# Patient Record
Sex: Male | Born: 1960 | Race: White | Hispanic: No | Marital: Single | Smoking: Never smoker
Health system: Southern US, Community
[De-identification: ages and names within clinical notes are randomized; demographics above are authoritative.]

## PROBLEM LIST (undated history)

## (undated) DIAGNOSIS — K449 Diaphragmatic hernia without obstruction or gangrene: Secondary | ICD-10-CM

## (undated) DIAGNOSIS — R002 Palpitations: Secondary | ICD-10-CM

## (undated) DIAGNOSIS — K219 Gastro-esophageal reflux disease without esophagitis: Secondary | ICD-10-CM

## (undated) HISTORY — DX: Gastro-esophageal reflux disease without esophagitis: K21.9

## (undated) HISTORY — DX: Diaphragmatic hernia without obstruction or gangrene: K44.9

---

## 2002-02-27 ENCOUNTER — Encounter: Payer: Self-pay | Admitting: Emergency Medicine

## 2002-02-27 ENCOUNTER — Inpatient Hospital Stay (HOSPITAL_COMMUNITY): Admission: EM | Admit: 2002-02-27 | Discharge: 2002-02-28 | Payer: Self-pay | Admitting: Emergency Medicine

## 2002-03-02 ENCOUNTER — Encounter: Payer: Self-pay | Admitting: Emergency Medicine

## 2002-03-03 ENCOUNTER — Inpatient Hospital Stay (HOSPITAL_COMMUNITY): Admission: EM | Admit: 2002-03-03 | Discharge: 2002-03-04 | Payer: Self-pay | Admitting: Emergency Medicine

## 2002-03-04 ENCOUNTER — Encounter: Payer: Self-pay | Admitting: Cardiology

## 2005-03-14 ENCOUNTER — Emergency Department (HOSPITAL_COMMUNITY): Admission: EM | Admit: 2005-03-14 | Discharge: 2005-03-14 | Payer: Self-pay | Admitting: Family Medicine

## 2005-03-18 ENCOUNTER — Ambulatory Visit: Payer: Self-pay | Admitting: Cardiology

## 2005-03-22 ENCOUNTER — Ambulatory Visit: Payer: Self-pay | Admitting: Cardiology

## 2006-01-14 ENCOUNTER — Emergency Department (HOSPITAL_COMMUNITY): Admission: EM | Admit: 2006-01-14 | Discharge: 2006-01-14 | Payer: Self-pay | Admitting: Family Medicine

## 2010-09-02 HISTORY — PX: UPPER GI ENDOSCOPY: SHX6162

## 2010-10-10 ENCOUNTER — Other Ambulatory Visit (HOSPITAL_COMMUNITY): Payer: Self-pay | Admitting: Otolaryngology

## 2010-10-10 DIAGNOSIS — R52 Pain, unspecified: Secondary | ICD-10-CM

## 2010-10-12 ENCOUNTER — Ambulatory Visit (HOSPITAL_COMMUNITY)
Admission: RE | Admit: 2010-10-12 | Discharge: 2010-10-12 | Disposition: A | Discharge status: Home / Self Care | Payer: Commercial Managed Care - PPO | Source: Ambulatory Visit | Attending: Otolaryngology | Admitting: Otolaryngology

## 2010-10-12 DIAGNOSIS — R52 Pain, unspecified: Secondary | ICD-10-CM

## 2010-10-12 DIAGNOSIS — Q283 Other malformations of cerebral vessels: Secondary | ICD-10-CM | POA: Insufficient documentation

## 2010-10-12 DIAGNOSIS — J3489 Other specified disorders of nose and nasal sinuses: Secondary | ICD-10-CM | POA: Insufficient documentation

## 2010-10-12 DIAGNOSIS — I679 Cerebrovascular disease, unspecified: Secondary | ICD-10-CM | POA: Insufficient documentation

## 2010-10-12 DIAGNOSIS — R51 Headache: Secondary | ICD-10-CM | POA: Insufficient documentation

## 2010-10-12 MED ORDER — GADOBENATE DIMEGLUMINE 529 MG/ML IV SOLN: 17.0000 mL | Freq: Once | INTRAVENOUS | Status: AC | PRN

## 2011-07-17 ENCOUNTER — Other Ambulatory Visit (HOSPITAL_COMMUNITY): Payer: Self-pay | Admitting: Family Medicine

## 2011-07-18 ENCOUNTER — Ambulatory Visit (HOSPITAL_COMMUNITY)
Admission: RE | Admit: 2011-07-18 | Discharge: 2011-07-18 | Disposition: A | Discharge status: Home / Self Care | Payer: 59 | Source: Ambulatory Visit | Attending: Family Medicine | Admitting: Family Medicine

## 2011-07-18 DIAGNOSIS — R109 Unspecified abdominal pain: Secondary | ICD-10-CM | POA: Insufficient documentation

## 2011-07-19 ENCOUNTER — Other Ambulatory Visit: Payer: Self-pay

## 2011-07-19 ENCOUNTER — Emergency Department (HOSPITAL_COMMUNITY): Payer: 59

## 2011-07-19 ENCOUNTER — Emergency Department (HOSPITAL_COMMUNITY)
Admission: EM | Admit: 2011-07-19 | Discharge: 2011-07-19 | Disposition: A | Discharge status: Home / Self Care | Payer: 59 | Attending: Emergency Medicine | Admitting: Emergency Medicine

## 2011-07-19 DIAGNOSIS — R002 Palpitations: Secondary | ICD-10-CM | POA: Insufficient documentation

## 2011-07-19 DIAGNOSIS — R110 Nausea: Secondary | ICD-10-CM | POA: Insufficient documentation

## 2011-07-19 DIAGNOSIS — R0789 Other chest pain: Secondary | ICD-10-CM | POA: Insufficient documentation

## 2011-07-19 DIAGNOSIS — K219 Gastro-esophageal reflux disease without esophagitis: Secondary | ICD-10-CM | POA: Insufficient documentation

## 2011-07-19 HISTORY — DX: Palpitations: R00.2

## 2011-07-19 LAB — COMPREHENSIVE METABOLIC PANEL
ALT: 25 U/L (ref 0–53)
AST: 23 U/L (ref 0–37)
Alkaline Phosphatase: 68 U/L (ref 39–117)
CO2: 26 mEq/L (ref 19–32)
Calcium: 9.7 mg/dL (ref 8.4–10.5)
Chloride: 102 mEq/L (ref 96–112)
GFR calc Af Amer: >90 mL/min (ref >90)
GFR calc non Af Amer: >90 mL/min (ref >90)
Glucose, Bld: 84 mg/dL (ref 70–99)
Sodium: 139 mEq/L (ref 135–145)
Total Bilirubin: 0.5 mg/dL (ref 0.3–1.2)

## 2011-07-19 LAB — CBC
Hemoglobin: 16.3 g/dL (ref 13.0–17.0)
MCH: 30.8 pg (ref 26.0–34.0)
Platelets: 151 10*3/uL (ref 150–400)
RBC: 5.3 MIL/uL (ref 4.22–5.81)
WBC: 6.3 10*3/uL (ref 4.0–10.5)

## 2011-07-19 MED ORDER — ASPIRIN 81 MG PO CHEW
324.0000 mg | CHEWABLE_TABLET | Freq: Once | ORAL | Status: AC
  Administered 2011-07-19: 324 mg via ORAL
  Filled 2011-07-19: qty 4

## 2011-07-19 MED ORDER — IOHEXOL 300 MG/ML  SOLN
80.0000 mL | Freq: Once | INTRAMUSCULAR | Status: AC | PRN
  Administered 2011-07-19: 80 mL via INTRAVENOUS

## 2011-07-19 MED ORDER — ESOMEPRAZOLE MAGNESIUM 40 MG PO CPDR: 40.0000 mg | DELAYED_RELEASE_CAPSULE | Freq: Every day | ORAL | Status: DC

## 2011-07-19 MED ORDER — GI COCKTAIL ~~LOC~~
30.0000 mL | Freq: Once | ORAL | Status: AC
  Administered 2011-07-19: 30 mL via ORAL
  Filled 2011-07-19: qty 30

## 2011-07-19 NOTE — ED Notes (Signed)
New and old ekg shown to dr Fredricka Bonine at [facility]

## 2011-07-19 NOTE — ED Provider Notes (Signed)
Medical screening examination/treatment/procedure(s) were performed by non-physician practitioner and as supervising physician I was immediately available for consultation/collaboration.   Jalaina Salyers M Dreydon Cardenas, DO 07/19/11 [id number] 

## 2011-07-19 NOTE — ED Provider Notes (Signed)
History     CSN: [id number] Arrival date & time: 07/19/2011 12:21 PM   None     Chief Complaint  Patient presents with  . Chest Pain    (Consider location/radiation/quality/duration/timing/severity/associated sxs/prior treatment) Patient is a 50 y.o. male presenting with chest pain. The history is provided by the patient. No language interpreter was used.  Chest Pain The chest pain began more than 2 weeks ago. Chest pain occurs intermittently. The chest pain is worsening. The pain is associated with eating. At its most intense, the pain is at 4/10. The pain is currently at 0/10. The severity of the pain is moderate. The quality of the pain is described as pressure-like. The pain does not radiate. Primary symptoms include palpitations and nausea. Pertinent negatives for primary symptoms include no fever, no shortness of breath, no cough, no wheezing, no abdominal pain, no vomiting and no dizziness.  The palpitations did not occur with dizziness or shortness of breath.  Pertinent negatives for associated symptoms include no diaphoresis, no lower extremity edema, no near-syncope, no numbness and no weakness. He tried nothing for the symptoms. Risk factors include male gender.  Pertinent negatives for past medical history include no aneurysm, no anxiety/panic attacks, no aortic aneurysm, no aortic dissection, no arrhythmia, no bicuspid aortic valve, no CAD, no cancer, no congenital heart disease, no connective tissue disease, no CHF, no DVT, no hyperlipidemia, no hypertension, no MI, no PE and no PVD.   Pt reports intermittant epigastric "pressure" x 3 weeks with a negative u/s of the abdomen yesterday.  States that on Tuesday he ate fried fish which made the pressure/pain worse immediately.  No major health problems.  Did not want to start nexium per his doctor [facility] with [facility].  Denies etoh but did drink a large cup of coffee this am.  Describes the pain as mostly "irritable"Today he was working at  his desk and felt nausea then  the pressure then felt heart palpatations that lasted just a few minutes.  He was working upstairs and came to the ER after that.   Denies SOB or vomiting.  Pain free presently.    Past Medical History  Diagnosis Date  . Palpitations     History reviewed. No pertinent past surgical history.  History reviewed. No pertinent family history.  History  Substance Use Topics  . Smoking status: Never Smoker   . Smokeless tobacco: Not on file  . Alcohol Use: No      Review of Systems  Constitutional: Negative for fever and diaphoresis.  Eyes: Negative.   Respiratory: Negative.  Negative for cough, shortness of breath and wheezing.   Cardiovascular: Positive for chest pain and palpitations. Negative for leg swelling and near-syncope.  Gastrointestinal: Positive for nausea. Negative for vomiting and abdominal pain.       Pressure under my diaphram intermittanly x 3 weeks.   Genitourinary: Negative.   Neurological: Negative.  Negative for dizziness, weakness and numbness.  Psychiatric/Behavioral: Negative.   All other systems reviewed and are negative.    Allergies  Penicillins  Home Medications   Current Outpatient Rx  Name Route Sig Dispense Refill  . THERA M PLUS PO TABS Oral Take 1 tablet by mouth daily.        BP 135/74  Pulse 83  Temp 97.5 F (36.4 C)  Resp 16  SpO2 98%  Physical Exam  Nursing note and vitals reviewed. Constitutional: He is oriented to person, place, and time. He appears well-developed and well-nourished.  HENT:  Head: Normocephalic and atraumatic.  Eyes: Pupils are equal, round, and reactive to light.  Neck: Normal range of motion. Neck supple.  Cardiovascular: Normal rate.   Pulmonary/Chest: Effort normal and breath sounds normal. No respiratory distress. He has no wheezes. He has no rales. He exhibits no tenderness.  Abdominal: Soft. Bowel sounds are normal. He exhibits no distension. There is no tenderness.  There is no guarding.  Musculoskeletal: Normal range of motion.  Neurological: He is alert and oriented to person, place, and time.  Skin: Skin is warm and dry.  Psychiatric: He has a normal mood and affect.    ED Course  Procedures (including critical care time)   Labs Reviewed  CBC  COMPREHENSIVE METABOLIC PANEL  I-STAT TROPONIN I  LIPASE, BLOOD   US Abdomen Complete  07/18/2011  *RADIOLOGY REPORT*  Clinical Data:  Abdominal pain.  Evaluate for cholecystitis  COMPLETE ABDOMINAL ULTRASOUND  Comparison:  None.  Findings:  Gallbladder:  No gallstones, gallbladder wall thickening, or pericholecystic fluid. Evaluation for a sonographic Murphy's sign is negative  Common bile duct:  Has a diameter 3.1 mm and a normal appearance  Liver:  No focal lesion identified.  Within normal limits in parenchymal echogenicity. No signs of intrahepatic ductal dilatation are identified  IVC:  Appears normal.  Pancreas:  Is poorly assessed due to shadowing from overlying gas  Spleen:  Has a sagittal length of 7.5 cm with no focal abnormalities seen  Right Kidney:  Demonstrates a sagittal length of 10.6 cm with no focal parenchymal abnormality or signs of hydronephrosis evident  Left Kidney:  Has a sagittal length of 10.7 cm with no focal parenchymal abnormality or signs of hydronephrosis evident  Abdominal aorta:  Has a maximal caliber of 2.0 cm with no aneurysmal dilatation seen.  IMPRESSION: Poor evaluation of the pancreas was possible due to shadowing from overlying bowel gas.  Otherwise unremarkable abdominal ultrasound with no signs of cholelithiasis or cholecystitis seen.  Original Report Authenticated By: Bertha Stakes, M.D.     No diagnosis found.    MDM  Epigastric pain x 3 weeks intermittant.  U/s negative for gallbladder disease.  PCP told him to try nexium but he did not want to.  CT of the abdomen was also normal today.  Pain worse after eating.  States that he will start the nexium now.   Cmet, CBC and lipase normal today.  Normal cardiac markers.  Pain free in the ER.          Jethro Bastos, NP 07/19/11 [phone]

## 2011-07-19 NOTE — ED Notes (Signed)
Patient presents with sudden onset of palpitations that lasted approx 5 min ago.  Patient also reports intermittent epigastric to midsternal chest pressure.  Patient had an ultrasound yesterday for his gall bladder and had a negative exam.

## 2011-07-19 NOTE — ED Notes (Signed)
Pt coming from work where he began having palpitations an epigastric pressure and discomfort.  Pt reports that he had an ultrasound yesterday of his gallbladder that was reported normal but pt still believes gallbladder is the root of his problem.

## 2011-07-22 ENCOUNTER — Encounter: Payer: Self-pay | Admitting: *Deleted

## 2011-07-23 ENCOUNTER — Encounter: Payer: Self-pay | Admitting: Gastroenterology

## 2011-07-23 ENCOUNTER — Ambulatory Visit (INDEPENDENT_AMBULATORY_CARE_PROVIDER_SITE_OTHER): Payer: Commercial Managed Care - PPO | Admitting: Gastroenterology

## 2011-07-23 VITALS — BP 128/82 | HR 68 | Ht 67.0 in | Wt 171.0 lb

## 2011-07-23 DIAGNOSIS — K219 Gastro-esophageal reflux disease without esophagitis: Secondary | ICD-10-CM | POA: Insufficient documentation

## 2011-07-23 DIAGNOSIS — R079 Chest pain, unspecified: Secondary | ICD-10-CM | POA: Insufficient documentation

## 2011-07-23 MED ORDER — SUCRALFATE 1 GM/10ML PO SUSP: ORAL | Status: DC

## 2011-07-23 MED ORDER — HYOSCYAMINE SULFATE 0.125 MG SL SUBL: 0.1250 mg | SUBLINGUAL_TABLET | SUBLINGUAL | Status: DC | PRN

## 2011-07-23 MED ORDER — DEXLANSOPRAZOLE 60 MG PO CPDR: DELAYED_RELEASE_CAPSULE | ORAL | Status: DC

## 2011-07-23 MED ORDER — HYOSCYAMINE SULFATE 0.125 MG SL SUBL: 0.1250 mg | SUBLINGUAL_TABLET | SUBLINGUAL | Status: AC | PRN

## 2011-07-23 NOTE — Progress Notes (Signed)
History of Present Illness:  This is a very pleasant 50 year old Caucasian male with 6 weeks of recurrent epigastric abdominal pain and subxiphoid pain with associated nausea, indigestion, occasional palpitations. He also has noticed intermittent right upper quadrant pain which is very transient in nature without real precipitating or alleviating elements. The patient has noticed that overeating worsens his epigastric pain, and he is on frequent small feeding meal plan. The patient does use antacids with some improvement. He previously was on large doses of Advil which he allegedly has not used in 6 months. Patient also has noticed occasional wakening from sleep with epigastric abdominal pain, but mostly notices an" air bubble" the subxiphoid area that would be alleviated by belching. There's no history of specific hepatobiliary or systemic complaints. He has had ultrasound of the upper abdomen and CT scan, both were reviewed and are unremarkable. Lab review shows normal CBC, liver profile, amylase and lipase. The patient has not had previous endoscopic exam. He denies any lower gastrointestinal problems, melena, or hematochezia, fever chills. Family history is remarkable for acid reflux problems in his parents and his brother. He is a previous negative cardiac evaluation, and cardiac enzymes with recent emergency room visit were normal.  I have reviewed this patient's present history, medical and surgical past history, allergies and medications.     ROS: The remainder of the 10 point ROS is negative... no previous abdominal surgery or known history of peptic ulcer disease, pancreatitis or hepatitis. Patient does not abuse alcohol, cigarettes, or NSAIDs.  Past Medical History  Diagnosis Date  . Palpitations    No past surgical history on file.  reports that he has never smoked. He has never used smokeless tobacco. He reports that he does not drink alcohol or use illicit drugs. family history includes  Arthritis in his father; Dementia in his mother; Heart disease in his maternal uncle; Hypertension in his mother; and Hypothyroidism in his mother and sister. Allergies  Allergen Reactions  . Penicillins Other (See Comments)    Unknown.          Physical Exam: General well developed well nourished patient in no acute distress, appearing his stated age Eyes PERRLA, no icterus, fundoscopic exam per opthamologist Skin no lesions noted Neck supple, no adenopathy, no thyroid enlargement, no tenderness Chest clear to percussion and auscultation Heart no significant murmurs, gallops or rubs noted Abdomen no hepatosplenomegaly masses or tenderness, BS normal.  Extremities no acute joint lesions, edema, phlebitis or evidence of cellulitis. Neurologic patient oriented x 3, cranial nerves intact, no focal neurologic deficits noted. Psychological mental status normal and normal affect.  Assessment and plan: Clinical presentation consistent with hiatal hernia, GERD, and esophageal spasm. I have placed him on Dexilant 60 mg a day with when necessary sublingual Levsin and when necessary Carafate suspension. Patient video to allow so as to reflux in his management. Endoscopic exam at his convenience has been scheduled. I see no need for further labs at this time  Encounter Diagnoses  Name Primary?  . GERD (gastroesophageal reflux disease) Yes  . Chest pain

## 2011-07-23 NOTE — Progress Notes (Signed)
Addended by: Richardson Chiquito on: 07/23/2011 09:22 AM   Modules accepted: Orders

## 2011-07-23 NOTE — Patient Instructions (Addendum)
You have been scheduled for an endoscopy. Please follow written instructions given to you at your visit today. We have sent the following medications to your pharmacy for you to pick up at your convenience: Levsin (dissolve 1 tablet under the tongue every 4 hours as needed) Dexilant (1 capsule by mouth at dinner in place of Nexium) Carafate (10 ml by mouth before meals and at bedtime) We have shown you a movie on GERD today. We have given you information regarding GERD. CC: Dr Ancil Boozer  Of note: D/C'ed Dexilant, Levsin and carafate prescriptions with Marisue Humble at [facility] in [place] as patient decided that he would rather have prescriptions filled at [facility] pharmacy after prescriptions were already sent. Ronny Bacon, CMA 07/23/11 @ 9:18 am

## 2011-08-02 ENCOUNTER — Ambulatory Visit (AMBULATORY_SURGERY_CENTER): Payer: 59 | Admitting: Gastroenterology

## 2011-08-02 ENCOUNTER — Encounter: Payer: Self-pay | Admitting: Gastroenterology

## 2011-08-02 VITALS — BP 131/80 | HR 82 | Temp 97.5°F | Resp 18 | Ht 67.0 in | Wt 171.0 lb

## 2011-08-02 DIAGNOSIS — R079 Chest pain, unspecified: Secondary | ICD-10-CM

## 2011-08-02 DIAGNOSIS — K219 Gastro-esophageal reflux disease without esophagitis: Secondary | ICD-10-CM

## 2011-08-02 MED ORDER — SODIUM CHLORIDE 0.9 % IV SOLN: 500.0000 mL | INTRAVENOUS | Status: DC

## 2011-08-02 NOTE — Progress Notes (Signed)
Patient did not experience any of the following events: a burn prior to discharge; a fall within the facility; wrong site/side/patient/procedure/implant event; or a hospital transfer or hospital admission upon discharge from the facility. (G8907) Patient did not have preoperative order for IV antibiotic SSI prophylaxis. (G8918)  

## 2011-08-02 NOTE — Patient Instructions (Signed)
Please review discharge instructions  Resume your normal medications  Await biopsy results  Please call Dr. Norval Gable office next week to set up follow up office visit for 1 month

## 2011-08-05 ENCOUNTER — Telehealth: Payer: Self-pay | Admitting: *Deleted

## 2011-08-05 DIAGNOSIS — K219 Gastro-esophageal reflux disease without esophagitis: Secondary | ICD-10-CM

## 2011-08-05 NOTE — Telephone Encounter (Signed)
LMOM

## 2011-08-09 ENCOUNTER — Telehealth: Payer: Self-pay | Admitting: Gastroenterology

## 2011-08-09 MED ORDER — DEXLANSOPRAZOLE 60 MG PO CPDR: DELAYED_RELEASE_CAPSULE | ORAL | Status: DC

## 2011-08-09 NOTE — Telephone Encounter (Signed)
rx sent pt aware 

## 2011-08-12 ENCOUNTER — Encounter: Payer: Self-pay | Admitting: Gastroenterology

## 2011-08-30 ENCOUNTER — Encounter: Payer: Self-pay | Admitting: *Deleted

## 2011-09-06 ENCOUNTER — Ambulatory Visit (INDEPENDENT_AMBULATORY_CARE_PROVIDER_SITE_OTHER): Payer: 59 | Admitting: Gastroenterology

## 2011-09-06 ENCOUNTER — Telehealth: Payer: Self-pay | Admitting: *Deleted

## 2011-09-06 ENCOUNTER — Encounter: Payer: Self-pay | Admitting: Gastroenterology

## 2011-09-06 ENCOUNTER — Other Ambulatory Visit (INDEPENDENT_AMBULATORY_CARE_PROVIDER_SITE_OTHER): Payer: 59

## 2011-09-06 VITALS — BP 118/72 | HR 80 | Ht 67.0 in | Wt 169.6 lb

## 2011-09-06 DIAGNOSIS — R1032 Left lower quadrant pain: Secondary | ICD-10-CM

## 2011-09-06 DIAGNOSIS — K219 Gastro-esophageal reflux disease without esophagitis: Secondary | ICD-10-CM

## 2011-09-06 LAB — URINALYSIS
Ketones, ur: NEGATIVE
Specific Gravity, Urine: <=1.005 (ref 1.000–1.030)
Total Protein, Urine: NEGATIVE
Urine Glucose: NEGATIVE
Urobilinogen, UA: 0.2 (ref 0.0–1.0)

## 2011-09-06 NOTE — Telephone Encounter (Signed)
Message copied by Leonette Monarch on Fri Sep 06, 2011 11:59 AM ------      Message from: Jarold Motto, DAVID R      Created: Fri Sep 06, 2011 11:54 AM       Please call..normal urine.Marland KitchenMarland Kitchen

## 2011-09-06 NOTE — Telephone Encounter (Signed)
Left message labs normal if he has questions to call back .

## 2011-09-06 NOTE — Patient Instructions (Signed)
Please go to the basement today for your labs.   

## 2011-09-06 NOTE — Progress Notes (Signed)
This is a very pleasant 51 year old Caucasian male with regurgitation completely eliminated by Dexilant 60 mg before his evening meal. Recent endoscopy was unremarkable otherwise. Biopsies for H. pylori were negative. He does complain of some bilateral flank pain, but denies other genitourinary problems. He denies a history of recurrent urinary tract infections or any symptoms of prostatic enlargement. Has made major lifestyle changes in his diet, exercise, and daily routines.  Current Medications, Allergies, Past Medical History, Past Surgical History, Family History and Social History were reviewed in [employer] record.  Pertinent Review of Systems Negative   Physical Exam: Blood pressure 118/72, BMI 26.6, pulse 80 and regular. Normal healthy-appearing patient in no distress. There is no CVA tenderness noted, and abdominal exam is unremarkable. Mental status is normal.   Assessment and Plan: GERD improving with PPI therapy and lifestyle changes. We will continue Dexilant for another 2 months, and then he may try to stop this medication depending on his symptomatology. I advised him that he will need colonoscopy at age 70. He is to call is for when necessary followup as needed. Urinalysis ordered per his complaints of bilateral CVA discomfort. Encounter Diagnoses  Name Primary?  Marland Kitchen LLQ pain Yes  . GERD (gastroesophageal reflux disease)

## 2013-07-08 ENCOUNTER — Other Ambulatory Visit: Payer: Self-pay

## 2014-06-01 ENCOUNTER — Other Ambulatory Visit: Payer: Self-pay | Admitting: Family Medicine

## 2014-06-01 ENCOUNTER — Ambulatory Visit
Admission: RE | Admit: 2014-06-01 | Discharge: 2014-06-01 | Disposition: A | Discharge status: Home / Self Care | Payer: 59 | Source: Ambulatory Visit | Attending: Family Medicine | Admitting: Family Medicine

## 2014-06-01 DIAGNOSIS — R1084 Generalized abdominal pain: Secondary | ICD-10-CM

## 2015-08-21 ENCOUNTER — Telehealth: Payer: 59 | Admitting: Family

## 2015-08-21 DIAGNOSIS — R05 Cough: Secondary | ICD-10-CM | POA: Diagnosis not present

## 2015-08-21 DIAGNOSIS — R059 Cough, unspecified: Secondary | ICD-10-CM

## 2015-08-21 DIAGNOSIS — J069 Acute upper respiratory infection, unspecified: Secondary | ICD-10-CM

## 2015-08-21 MED ORDER — AZITHROMYCIN 250 MG PO TABS: ORAL_TABLET | ORAL | Status: DC

## 2015-08-21 MED ORDER — BENZONATATE 200 MG PO CAPS: 200.0000 mg | ORAL_CAPSULE | Freq: Three times a day (TID) | ORAL | Status: DC | PRN

## 2015-08-21 NOTE — Progress Notes (Signed)

## 2015-08-31 ENCOUNTER — Telehealth: Payer: 59 | Admitting: Physician Assistant

## 2015-08-31 DIAGNOSIS — J019 Acute sinusitis, unspecified: Secondary | ICD-10-CM | POA: Diagnosis not present

## 2015-08-31 DIAGNOSIS — B9689 Other specified bacterial agents as the cause of diseases classified elsewhere: Secondary | ICD-10-CM

## 2015-08-31 DIAGNOSIS — R05 Cough: Secondary | ICD-10-CM

## 2015-08-31 DIAGNOSIS — R059 Cough, unspecified: Secondary | ICD-10-CM

## 2015-08-31 MED ORDER — DOXYCYCLINE HYCLATE 100 MG PO CAPS: 100.0000 mg | ORAL_CAPSULE | Freq: Two times a day (BID) | ORAL | Status: DC

## 2015-08-31 NOTE — Progress Notes (Signed)
We are sorry that you are not feeling well.  Here is how we plan to help!  Based on what you have shared with me it looks like you have sinusitis.  Sinusitis is inflammation and infection in the sinus cavities of the head.  Based on your presentation I believe you most likely have Acute Bacterial Sinusitis.  This is an infection caused by bacteria and is treated with antibiotics. I have prescribed Doxycycline 100mg  by mouth twice a day for 10 days. You may use an oral decongestant such as Mucinex D or if you have glaucoma or high blood pressure use plain Mucinex. Saline nasal spray help and can safely be used as often as needed for congestion.    If you develop worsening sinus pain, fever or notice severe headache and vision changes, or if symptoms are not better after 3-4 days of antibiotic, please schedule an appointment with a health care provider as you need examination and potentially imaging.  Sinus infections are not as easily transmitted as other respiratory infection, however we still recommend that you avoid close contact with loved ones, especially the very young and elderly.  Remember to wash your hands thoroughly throughout the day as this is the number one way to prevent the spread of infection!  Home Care:  Only take medications as instructed by your medical team.  Complete the entire course of an antibiotic.  Do not take these medications with alcohol.  A steam or ultrasonic humidifier can help congestion.  You can place a towel over your head and breathe in the steam from hot water coming from a faucet.  Avoid close contacts especially the very young and the elderly.  Cover your mouth when you cough or sneeze.  Always remember to wash your hands.  Get Help Right Away If:  You develop worsening fever or sinus pain.  You develop a severe head ache or visual changes.  Your symptoms persist after you have completed your treatment plan.  Make sure you  Understand these  instructions.  Will watch your condition.  Will get help right away if you are not doing well or get worse.  Your e-visit answers were reviewed by a board certified advanced clinical practitioner to complete your personal care plan.  Depending on the condition, your plan could have included both over the counter or prescription medications.  If there is a problem please reply  once you have received a response from your provider.  Your safety is important to us.  If you have drug allergies check your prescription carefully.    You can use MyChart to ask questions about today's visit, request a non-urgent call back, or ask for a work or school excuse for 24 hours related to this e-Visit. If it has been greater than 24 hours you will need to follow up with your provider, or enter a new e-Visit to address those concerns.  You will get an e-mail in the next two days asking about your experience.  I hope that your e-visit has been valuable and will speed your recovery. Thank you for using e-visits.

## 2015-10-07 DIAGNOSIS — H5213 Myopia, bilateral: Secondary | ICD-10-CM | POA: Diagnosis not present

## 2016-02-26 ENCOUNTER — Telehealth: Payer: 59 | Admitting: Family

## 2016-02-26 DIAGNOSIS — J069 Acute upper respiratory infection, unspecified: Secondary | ICD-10-CM | POA: Diagnosis not present

## 2016-02-26 MED ORDER — AZITHROMYCIN 250 MG PO TABS: ORAL_TABLET | ORAL | Status: DC

## 2016-02-26 MED ORDER — BENZONATATE 100 MG PO CAPS: 100.0000 mg | ORAL_CAPSULE | Freq: Three times a day (TID) | ORAL | Status: DC | PRN

## 2016-02-26 NOTE — Progress Notes (Signed)

## 2016-03-07 DIAGNOSIS — L309 Dermatitis, unspecified: Secondary | ICD-10-CM | POA: Diagnosis not present

## 2016-04-03 ENCOUNTER — Encounter: Payer: Self-pay | Admitting: Gastroenterology

## 2016-05-22 ENCOUNTER — Ambulatory Visit: Payer: 59 | Admitting: *Deleted

## 2016-05-22 VITALS — Ht 67.5 in | Wt 199.6 lb

## 2016-05-22 DIAGNOSIS — Z1211 Encounter for screening for malignant neoplasm of colon: Secondary | ICD-10-CM

## 2016-05-22 MED ORDER — SUPREP BOWEL PREP KIT 17.5-3.13-1.6 GM/177ML PO SOLN: 1.0000 | Freq: Once | ORAL | 0 refills | Status: AC

## 2016-05-22 NOTE — Progress Notes (Signed)
Patient denies any allergies to egg or soy products. Patient denies complications with anesthesia/sedation.  Patient denies oxygen use at home and denies diet medications. Colonoscopy explained and pamphlet given.

## 2016-06-05 ENCOUNTER — Ambulatory Visit (AMBULATORY_SURGERY_CENTER): Payer: 59 | Admitting: Gastroenterology

## 2016-06-05 ENCOUNTER — Encounter: Payer: Self-pay | Admitting: Gastroenterology

## 2016-06-05 VITALS — BP 102/62 | HR 72 | Temp 98.6°F | Resp 18 | Ht 67.0 in | Wt 199.0 lb

## 2016-06-05 DIAGNOSIS — Z1212 Encounter for screening for malignant neoplasm of rectum: Secondary | ICD-10-CM | POA: Diagnosis not present

## 2016-06-05 DIAGNOSIS — Z1211 Encounter for screening for malignant neoplasm of colon: Secondary | ICD-10-CM

## 2016-06-05 MED ORDER — SODIUM CHLORIDE 0.9 % IV SOLN: 500.0000 mL | INTRAVENOUS | Status: AC

## 2016-06-05 NOTE — Patient Instructions (Signed)
YOU HAD AN ENDOSCOPIC PROCEDURE TODAY AT THE [facility]:   Refer to the procedure report that was given to you for any specific questions about what was found during the examination.  If the procedure report does not answer your questions, please call your gastroenterologist to clarify.  If you requested that your care partner not be given the details of your procedure findings, then the procedure report has been included in a sealed envelope for you to review at your convenience later.  YOU SHOULD EXPECT: Some feelings of bloating in the abdomen. Passage of more gas than usual.  Walking can help get rid of the air that was put into your GI tract during the procedure and reduce the bloating. If you had a lower endoscopy (such as a colonoscopy or flexible sigmoidoscopy) you may notice spotting of blood in your stool or on the toilet paper. If you underwent a bowel prep for your procedure, you may not have a normal bowel movement for a few days.  Please Note:  You might notice some irritation and congestion in your nose or some drainage.  This is from the oxygen used during your procedure.  There is no need for concern and it should clear up in a day or so.  SYMPTOMS TO REPORT IMMEDIATELY:   Following lower endoscopy (colonoscopy or flexible sigmoidoscopy):  Excessive amounts of blood in the stool  Significant tenderness or worsening of abdominal pains  Swelling of the abdomen that is new, acute  Fever of 100F or higher   For urgent or emergent issues, a gastroenterologist can be reached at any hour by calling [phone].   DIET:  We do recommend a small meal at first, but then you may proceed to your regular diet.  Drink plenty of fluids but you should avoid alcoholic beverages for 24 hours.  ACTIVITY:  You should plan to take it easy for the rest of today and you should NOT DRIVE or use heavy machinery until tomorrow (because of the sedation medicines used during the test).     FOLLOW UP: Our staff will call the number listed on your records the next business day following your procedure to check on you and address any questions or concerns that you may have regarding the information given to you following your procedure. If we do not reach you, we will leave a message.  However, if you are feeling well and you are not experiencing any problems, there is no need to return our call.  We will assume that you have returned to your regular daily activities without incident.  If any biopsies were taken you will be contacted by phone or by letter within the next 1-3 weeks.  Please call us at [phone] if you have not heard about the biopsies in 3 weeks.    SIGNATURES/CONFIDENTIALITY: You and/or your care partner have signed paperwork which will be entered into your electronic medical record.  These signatures attest to the fact that that the information above on your After Visit Summary has been reviewed and is understood.  Full responsibility of the confidentiality of this discharge information lies with you and/or your care-partner.  You will need another colonoscopy in 10 years. Thank-you for choosing us for your healthcare needs today.

## 2016-06-05 NOTE — Op Note (Signed)
[facility] Patient Name: Kenneth Jacobs Procedure Date: 06/05/2016 10:25 AM MRN: [id number] Endoscopist: Sherilyn Cooter L. Myrtie Neither , MD Age: 55 Referring MD:  Date of Birth: 11/05/1960 Gender: Male Account #: [id number] Procedure:                Colonoscopy Indications:              Screening for colorectal malignant neoplasm, This                            is the patient's first colonoscopy Medicines:                Monitored Anesthesia Care Procedure:                Pre-Anesthesia Assessment:                           - Prior to the procedure, a History and Physical                            was performed, and patient medications and                            allergies were reviewed. The patient's tolerance of                            previous anesthesia was also reviewed. The risks                            and benefits of the procedure and the sedation                            options and risks were discussed with the patient.                            All questions were answered, and informed consent                            was obtained. Prior Anticoagulants: The patient has                            taken no previous anticoagulant or antiplatelet                            agents. ASA Grade Assessment: I - A normal, healthy                            patient. After reviewing the risks and benefits,                            the patient was deemed in satisfactory condition to                            undergo the procedure.  After obtaining informed consent, the colonoscope                            was passed under direct vision. Throughout the                            procedure, the patient's blood pressure, pulse, and                            oxygen saturations were monitored continuously. The                            Model CF-HQ190L [id number]) scope was introduced                            through the anus and advanced to the  the cecum,                            identified by appendiceal orifice and ileocecal                            valve. The colonoscopy was performed without                            difficulty. The patient tolerated the procedure                            well. The quality of the bowel preparation was                            excellent. The ileocecal valve, appendiceal                            orifice, and rectum were photographed. The quality                            of the bowel preparation was evaluated using the                            BBPS [facility] Bowel Preparation Scale) with scores                            of: Right Colon = 3, Transverse Colon = 3 and Left                            Colon = 3 (entire mucosa seen well with no residual                            staining, small fragments of stool or opaque                            liquid). The total BBPS score equals 9. The bowel  preparation used was SUPREP. Scope In: 10:34:15 AM Scope Out: 10:44:55 AM Scope Withdrawal Time: 0 hours 9 minutes 0 seconds  Total Procedure Duration: 0 hours 10 minutes 40 seconds  Findings:                 The perianal and digital rectal examinations were                            normal.                           The entire examined colon appeared normal on direct                            and retroflexion views. Complications:            No immediate complications. Estimated Blood Loss:     Estimated blood loss: none. Impression:               - The entire examined colon is normal on direct and                            retroflexion views.                           - No specimens collected. Recommendation:           - Patient has a contact number available for                            emergencies. The signs and symptoms of potential                            delayed complications were discussed with the                            patient. Return to  normal activities tomorrow.                            Written discharge instructions were provided to the                            patient.                           - Resume previous diet.                           - Continue present medications.                           - Repeat colonoscopy in 10 years for screening                            purposes. [name], MD 06/05/2016 10:48:56 AM This report has been signed electronically.

## 2016-06-05 NOTE — Progress Notes (Signed)
Report to PACU, RN, vss, BBS= Clear.  

## 2016-06-06 ENCOUNTER — Telehealth: Payer: Self-pay | Admitting: *Deleted

## 2016-06-06 NOTE — Telephone Encounter (Signed)
  Follow up Call-  Call back number 06/05/2016  Post procedure Call Back phone  # [phone]  Permission to leave phone message Yes  Some recent data might be hidden   [facility]

## 2016-06-06 NOTE — Telephone Encounter (Signed)
  Follow up Call-  Call back number 06/05/2016  Post procedure Call Back phone  # [phone]  Permission to leave phone message Yes  Some recent data might be hidden     Patient questions:  Do you have a fever, pain , or abdominal swelling? No. Pain Score  0 *  Have you tolerated food without any problems? Yes  Have you been able to return to your normal activities? Yes.    Do you have any questions about your discharge instructions: Diet   No. Medications  No. Follow up visit  No.  Do you have questions or concerns about your Care? No.  Actions: * If pain score is 4 or above: No action needed, pain <4.

## 2016-06-10 ENCOUNTER — Telehealth: Payer: Self-pay | Admitting: Gastroenterology

## 2016-06-10 NOTE — Telephone Encounter (Signed)
Pt aware.

## 2016-06-10 NOTE — Telephone Encounter (Signed)
Sorry to hear he is not feeling well.    Simethicone tablets 125 mg (over the counter), take one twice daily.  The bowel habits will right themselves.

## 2016-06-10 NOTE — Telephone Encounter (Signed)
Left message to call back.  Pt states he had a colon done 06/05/16. States since the procedure he has not felt well. Reports his abdomen is very bloated and full to the point he feels a little nauseated. States he feels like he may have a low grade temp in the evenings, he has not taken his temp though. His bowels are moving maybe once a day and normally he has 3 bowel movements a day. Pt wants to know what Dr. Myrtie Neitheranis would suggest. Please advise.

## 2016-09-28 DIAGNOSIS — R05 Cough: Secondary | ICD-10-CM | POA: Diagnosis not present

## 2016-09-28 DIAGNOSIS — M5489 Other dorsalgia: Secondary | ICD-10-CM | POA: Diagnosis not present

## 2016-09-28 DIAGNOSIS — J209 Acute bronchitis, unspecified: Secondary | ICD-10-CM | POA: Diagnosis not present

## 2017-05-13 DIAGNOSIS — H5213 Myopia, bilateral: Secondary | ICD-10-CM | POA: Diagnosis not present

## 2017-10-01 ENCOUNTER — Other Ambulatory Visit (HOSPITAL_COMMUNITY): Payer: Self-pay | Admitting: Family Medicine

## 2017-10-01 DIAGNOSIS — R52 Pain, unspecified: Secondary | ICD-10-CM

## 2017-10-01 DIAGNOSIS — R079 Chest pain, unspecified: Secondary | ICD-10-CM | POA: Diagnosis not present

## 2017-10-02 ENCOUNTER — Ambulatory Visit (HOSPITAL_COMMUNITY)
Admission: RE | Admit: 2017-10-02 | Discharge: 2017-10-02 | Disposition: A | Discharge status: Home / Self Care | Payer: 59 | Source: Ambulatory Visit | Attending: Family Medicine | Admitting: Family Medicine

## 2017-10-02 ENCOUNTER — Other Ambulatory Visit (HOSPITAL_COMMUNITY): Payer: Self-pay | Admitting: Family Medicine

## 2017-10-02 DIAGNOSIS — R079 Chest pain, unspecified: Secondary | ICD-10-CM | POA: Insufficient documentation

## 2017-10-02 DIAGNOSIS — R52 Pain, unspecified: Secondary | ICD-10-CM

## 2017-10-02 DIAGNOSIS — M7989 Other specified soft tissue disorders: Secondary | ICD-10-CM | POA: Diagnosis not present

## 2017-10-02 DIAGNOSIS — M79601 Pain in right arm: Secondary | ICD-10-CM | POA: Insufficient documentation

## 2017-10-02 DIAGNOSIS — R0789 Other chest pain: Secondary | ICD-10-CM | POA: Diagnosis not present

## 2017-10-02 NOTE — Progress Notes (Signed)
Preliminary results by tech - Right Upper Ext. Venous Duplex Completed. Negative for deep and superficial vein thrombosis. Cassadie Pankonin, BS, RDMS, RVT  

## 2017-11-17 ENCOUNTER — Ambulatory Visit: Payer: Self-pay | Admitting: Nurse Practitioner

## 2017-11-17 ENCOUNTER — Encounter: Payer: Self-pay | Admitting: Nurse Practitioner

## 2017-11-17 VITALS — BP 120/90 | HR 85 | Temp 97.5°F | Wt 200.0 lb

## 2017-11-17 DIAGNOSIS — J069 Acute upper respiratory infection, unspecified: Secondary | ICD-10-CM

## 2017-11-17 DIAGNOSIS — J029 Acute pharyngitis, unspecified: Secondary | ICD-10-CM

## 2017-11-17 LAB — POCT RAPID STREP A (OFFICE): Rapid Strep A Screen: NEGATIVE

## 2017-11-17 NOTE — Progress Notes (Signed)
Subjective:  Kenneth Jacobs is a 57 y.o. male who presents for evaluation of URI like symptoms.  Symptoms include achiness, fever: suspected fevers but not measured at home, nasal congestion, sore throat and swollen glands.  Patient denies any throat pain at this time. Onset of symptoms was 3 days ago, and has been gradually worsening since that time.  Treatment to date:  ibuprofen.  High risk factors for influenza complications:  none.  The following portions of the patient's history were reviewed and updated as appropriate:  allergies, current medications and past medical history.  Constitutional: positive for fatigue, fevers and malaise, negative for anorexia, chills, night sweats and sweats Eyes: negative Ears, nose, mouth, throat, and face: positive for nasal congestion and sore throat, negative for ear drainage, earaches and hoarseness Respiratory: negative Cardiovascular: negative Gastrointestinal: negative Neurological: negative Allergic/Immunologic: negative Objective:  BP 120/90   Pulse 85   Temp (!) 97.5 F (36.4 C)   Wt 200 lb (90.7 kg)   SpO2 98%   BMI 31.32 kg/m  General appearance: alert, cooperative and no distress Head: Normocephalic, without obvious abnormality, atraumatic Eyes: conjunctivae/corneas clear. PERRL, EOM's intact. Fundi benign. Ears: normal TM's and external ear canals both ears Nose: no discharge, turbinates swollen, inflamed, no sinus tenderness Throat: abnormal findings: mild oropharyngeal erythema Lungs: clear to auscultation bilaterally Heart: regular rate and rhythm, S1, S2 normal, no murmur, click, rub or gallop Abdomen: soft, non-tender; bowel sounds normal; no masses,  no organomegaly Pulses: 2+ and symmetric Skin: Skin color, texture, turgor normal. No rashes or lesions Lymph nodes: mild cervical adenopathy bilaterally Neurologic: Grossly normal  Strep Test:  Negative   Assessment:  viral upper respiratory illness    Plan:   Discussed diagnosis and treatment of URI. Discussed the importance of avoiding unnecessary antibiotic therapy. Educational material distributed and questions answered. Suggested symptomatic OTC remedies. Supportive care with appropriate antipyretics and fluids. Nasal saline spray for congestion. Follow up as needed. Continue taking Ibuprofen as needed for throat pain, fever or general discomfort.  Use honey with lemon, cough drops, warm saltwater gargles for throat pain.  Follow up in symptoms do not improve in 7-10 days.

## 2017-11-17 NOTE — Patient Instructions (Addendum)

## 2017-11-20 ENCOUNTER — Telehealth: Payer: Self-pay

## 2017-11-20 ENCOUNTER — Telehealth: Payer: Self-pay | Admitting: Emergency Medicine

## 2017-11-20 NOTE — Telephone Encounter (Signed)
Called to f/u with pt to see how he was feeling and pt states he ended up doing an E visit because he wasn't getting any better and started coughing up green mucous and he was given an antibiotic.

## 2017-11-20 NOTE — Telephone Encounter (Signed)
Spoke to Kenneth Jacobs patients wife and she stated Kenneth Jacobs did an evisit and the provider put him on an antibiotic that is in the pcn family and he wanted something different. After speaking to the provider mrs leath she stated she notified Kenneth Jacobs to follow up with Kenneth Jacobs in 7 to 10 days if he is not feeling better and at this time she did not want to call him in an antibiotic. I notified the wife and she stated she understood and they would follow up if they needed to.

## 2017-11-25 DIAGNOSIS — R05 Cough: Secondary | ICD-10-CM | POA: Diagnosis not present

## 2018-02-02 DIAGNOSIS — M79674 Pain in right toe(s): Secondary | ICD-10-CM | POA: Diagnosis not present

## 2018-02-02 DIAGNOSIS — L03031 Cellulitis of right toe: Secondary | ICD-10-CM | POA: Diagnosis not present

## 2018-02-25 DIAGNOSIS — R7303 Prediabetes: Secondary | ICD-10-CM | POA: Diagnosis not present

## 2018-02-25 DIAGNOSIS — Z0000 Encounter for general adult medical examination without abnormal findings: Secondary | ICD-10-CM | POA: Diagnosis not present

## 2018-02-25 DIAGNOSIS — E785 Hyperlipidemia, unspecified: Secondary | ICD-10-CM | POA: Diagnosis not present

## 2018-02-25 DIAGNOSIS — Z5181 Encounter for therapeutic drug level monitoring: Secondary | ICD-10-CM | POA: Diagnosis not present

## 2018-02-25 DIAGNOSIS — Z125 Encounter for screening for malignant neoplasm of prostate: Secondary | ICD-10-CM | POA: Diagnosis not present

## 2018-11-26 IMAGING — CR DG CHEST 2V
2 series · 2 of 2 positions shown · non-contrast
Comparison: Chest radiograph 07/19/2011

CLINICAL DATA: Patient with anterior chest wall pain and
tenderness.

EXAM:
CHEST  2 VIEW

[chest pa]
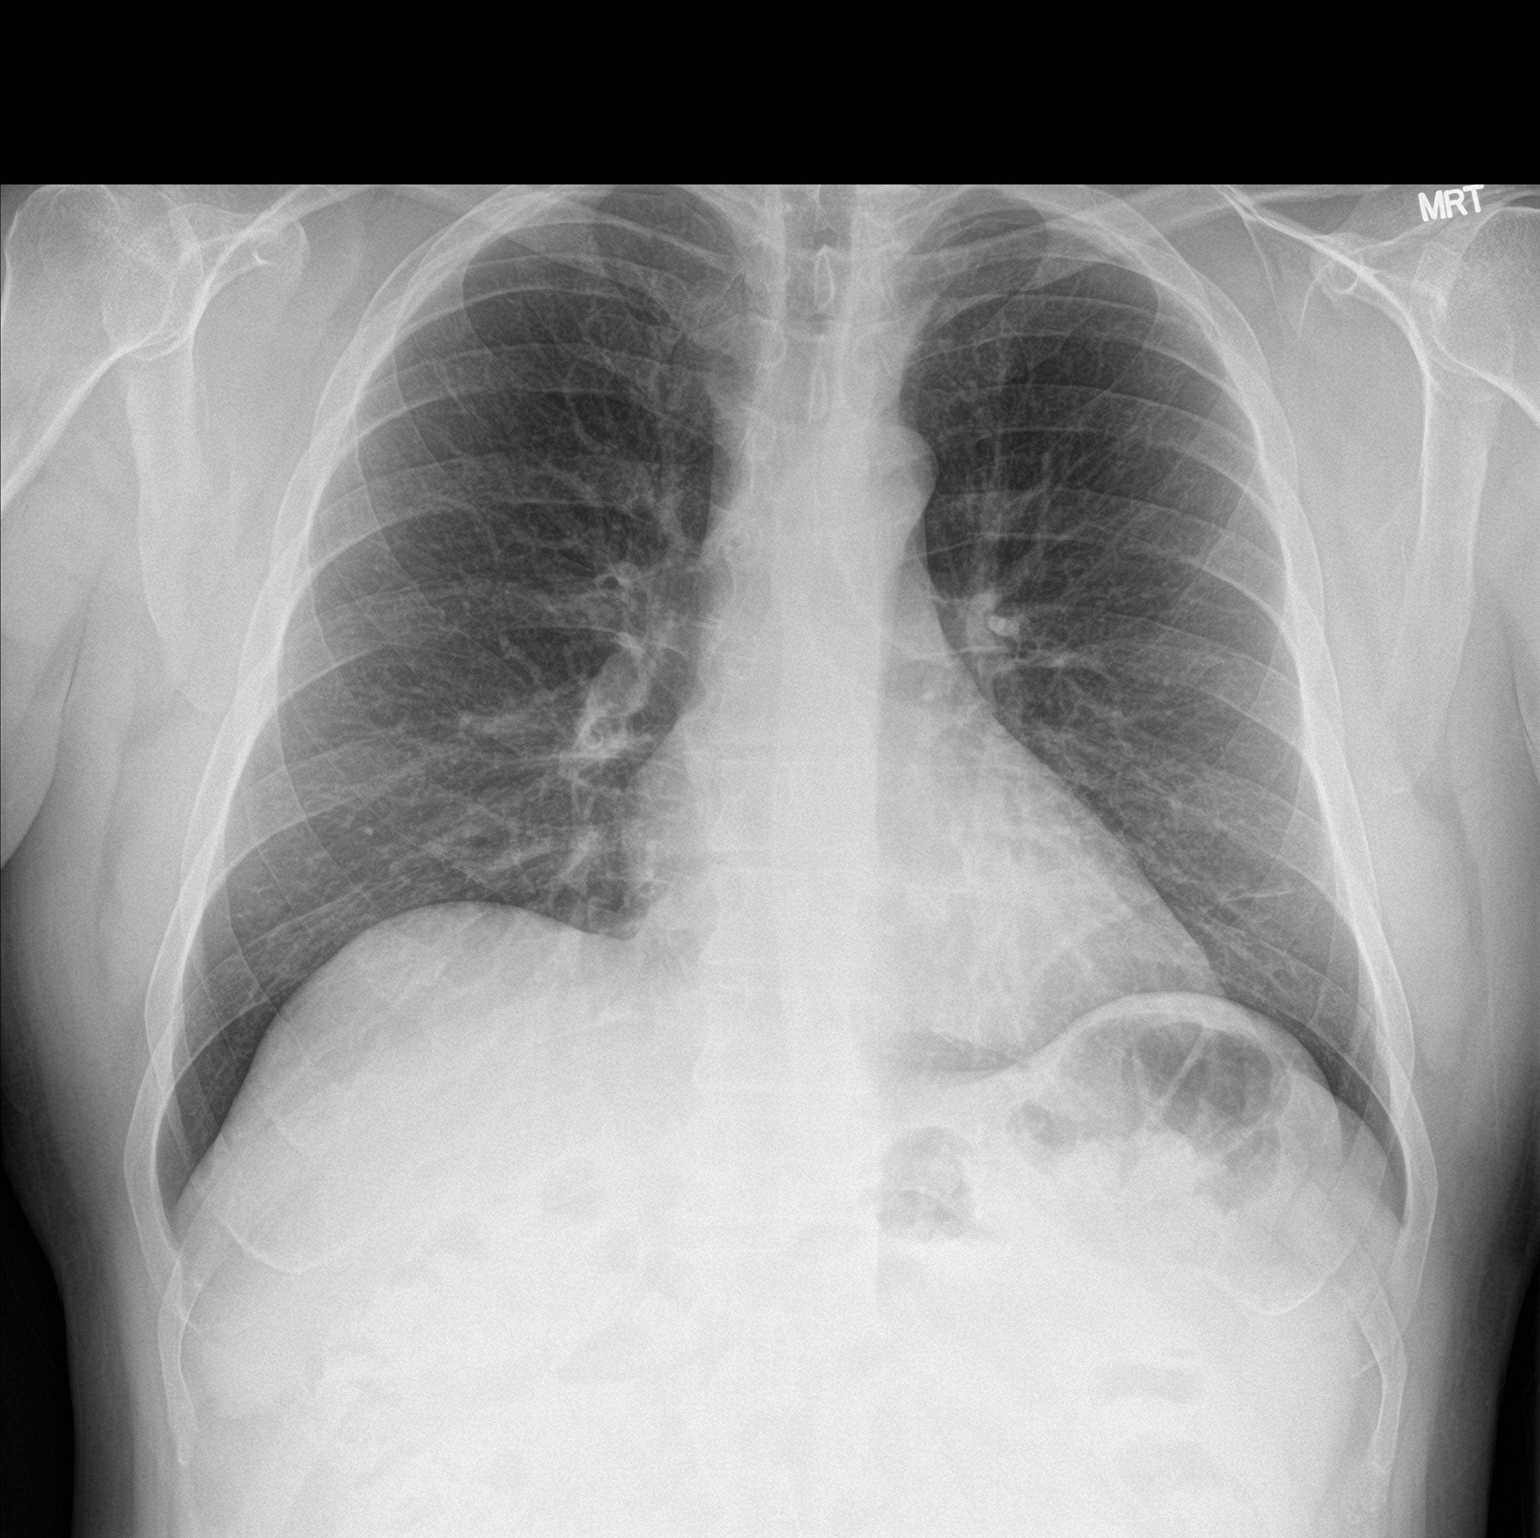

[chest lat]
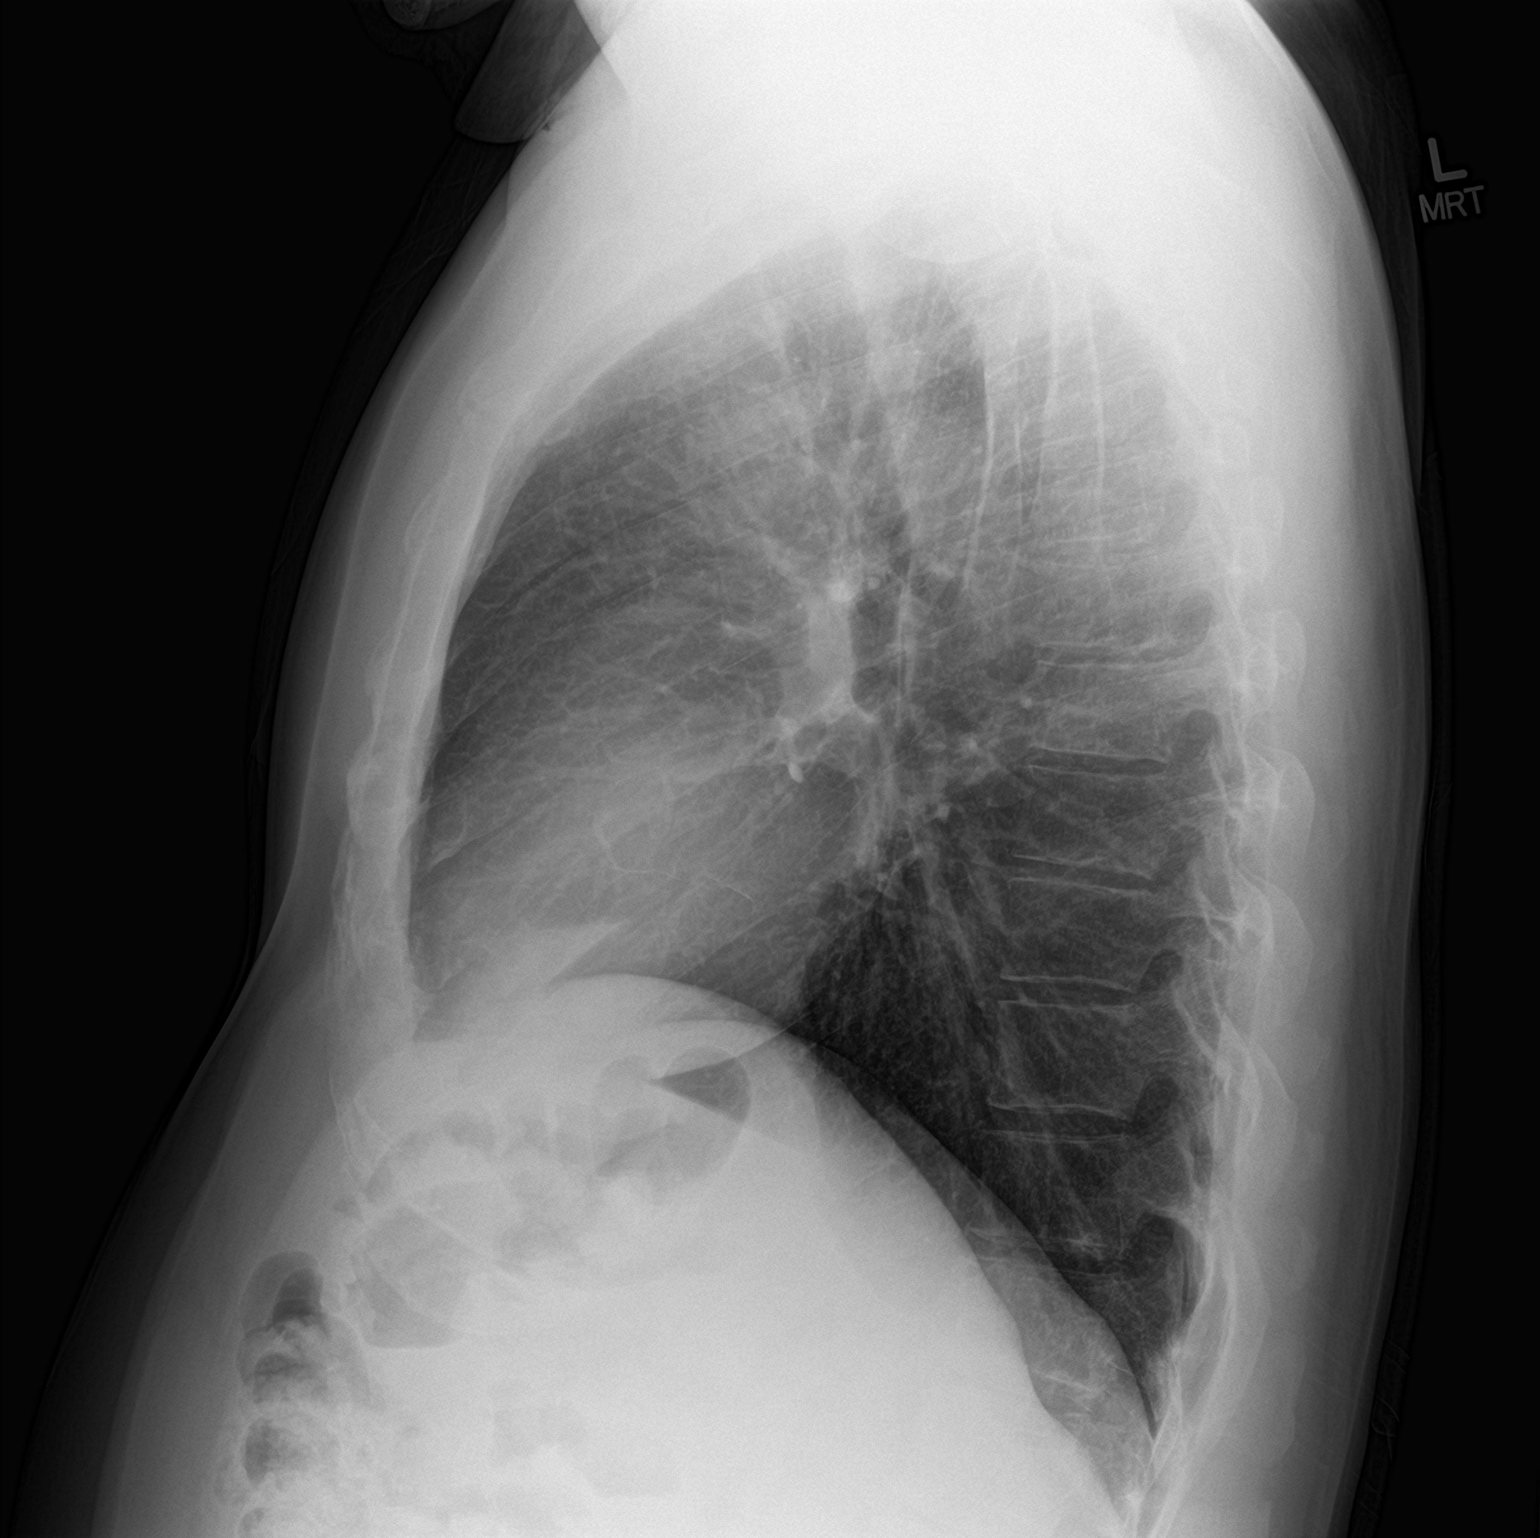

[2 of 2 positions shown; findings below may reference images not displayed]

FINDINGS: Normal cardiac and mediastinal contours. Minimal
scarring/atelectasis left lower lobe. No large area of pulmonary
consolidation. No pleural effusion or pneumothorax. Regional
skeleton is unremarkable.
IMPRESSION: No acute cardiopulmonary process.
